# Patient Record
Sex: Male | Born: 1955 | Race: White | Hispanic: No | Marital: Married | Smoking: Never smoker
Health system: Southern US, Community
[De-identification: ages and names within clinical notes are randomized; demographics above are authoritative.]

## PROBLEM LIST (undated history)

## (undated) DIAGNOSIS — J45909 Unspecified asthma, uncomplicated: Secondary | ICD-10-CM

## (undated) HISTORY — DX: Unspecified asthma, uncomplicated: J45.909

---

## 2005-02-14 ENCOUNTER — Ambulatory Visit: Payer: Self-pay | Admitting: Gastroenterology

## 2005-02-19 ENCOUNTER — Ambulatory Visit: Payer: Self-pay | Admitting: Gastroenterology

## 2005-03-08 ENCOUNTER — Ambulatory Visit: Payer: Self-pay | Admitting: Gastroenterology

## 2005-03-19 ENCOUNTER — Ambulatory Visit: Payer: Self-pay | Admitting: Gastroenterology

## 2012-09-21 ENCOUNTER — Ambulatory Visit: Payer: Self-pay | Admitting: Specialist

## 2012-12-16 HISTORY — PX: SHOULDER ARTHROSCOPY W/ ROTATOR CUFF REPAIR: SHX2400

## 2013-01-05 ENCOUNTER — Ambulatory Visit: Payer: Self-pay | Admitting: Specialist

## 2013-01-05 LAB — CBC WITH DIFFERENTIAL/PLATELET
Basophil #: 0 10*3/uL (ref 0.0–0.1)
Basophil %: 0.5 %
Eosinophil %: 1.2 %
HCT: 40.9 % (ref 40.0–52.0)
Lymphocyte #: 1.7 10*3/uL (ref 1.0–3.6)
Lymphocyte %: 33.8 %
MCH: 31.1 pg (ref 26.0–34.0)
MCHC: 34.9 g/dL (ref 32.0–36.0)
MCV: 89 fL (ref 80–100)
Monocyte #: 0.5 x10 3/mm (ref 0.2–1.0)
Monocyte %: 10.6 %
Neutrophil #: 2.7 10*3/uL (ref 1.4–6.5)
Neutrophil %: 53.9 %
Platelet: 186 10*3/uL (ref 150–440)
WBC: 5 10*3/uL (ref 3.8–10.6)

## 2013-01-05 LAB — BASIC METABOLIC PANEL
Anion Gap: 8 (ref 7–16)
BUN: 23 mg/dL — ABNORMAL HIGH (ref 7–18)
Calcium, Total: 8.2 mg/dL — ABNORMAL LOW (ref 8.5–10.1)
Chloride: 106 mmol/L (ref 98–107)
Co2: 26 mmol/L (ref 21–32)
Creatinine: 1.18 mg/dL (ref 0.60–1.30)
EGFR (Non-African Amer.): >60
Potassium: 4.4 mmol/L (ref 3.5–5.1)
Sodium: 140 mmol/L (ref 136–145)

## 2013-01-12 ENCOUNTER — Ambulatory Visit: Payer: Self-pay | Admitting: Specialist

## 2015-04-07 NOTE — Op Note (Signed)
PATIENT NAME:  Jeffery Hayes, Jeffery Hayes MR#:  [id number] DATE OF BIRTH:  Apr 20, 1956  DATE OF PROCEDURE:  01/12/2013  PREOPERATIVE DIAGNOSES: 1.  Tear of the left rotator cuff. 2.  Left acromioclavicular joint arthritis.  3.  Impingement syndrome, left shoulder.  POSTOPERAIVE DIAGNOSES:   1.  A 1.5 cm tear, left rotator cuff, just behind the biceps tendon.  2.  Severe acromioclavicular joint arthritis, left shoulder.  3.  Severe impingement and bursitis, left shoulder.   OPERATIONS:  1.  Arthroscopic left rotator cuff repair using a 5.5 ArthroCare speed screw.  2.  Arthroscopic excision of distal clavicle.  3.  Arthroscopic decompression, left subacromial space, with partial acromioplasty.   SURGEON: Jeffery Hayes, M.D.   ANESTHESIA: General endotracheal plus interscalene block.   COMPLICATIONS: None.   DRAINS: None.   ESTIMATED BLOOD LOSS: Minimal.  REPLACED: None.   OPERATIVE FINDINGS: The patient had a 1.5 cm tear of the rotator cuff just behind the biceps tendon. There was thinning of the cuff behind this, as well. Severe bursitis and impingement of the anterior acromion and clavicle. The glenohumeral joint showed normal articular surfaces and biceps tendon. The undersurface of the rotator cuff showed severe crabmeat-like fraying. Tear was visible from beneath.   OPERATIVE PROCEDURE: The patient was brought to the operating room where he underwent satisfactory general endotracheal anesthesia, after a left interscalene block had been put in place. He was turned in the right lateral decubitus position and padded appropriately on a beanbag. The left shoulder was prepped and draped in sterile fashion and arthroscopy carried out through posterior portal with accessory portals being made anteriorly and laterally. The above findings were noted upon survey in the joint and the bursa. In the joint, the motorized shaver was brought in from the anterior portal and the undersurface of the cuff,  where it was severely frayed, was debrided. This showed that there was a tear in the anterior portion. The biceps tendon was intact and left in place. There was minimal synovitis and minimal debridement of the joint itself. The arthroscope was redirected into the subacromial space and significant bursitis was excised with the shaver. The tear anteriorly was identified and the shaver used to trim it up and clean it. The ArthroCare wand was used to clear soft tissue off the tuberosity. Bur was used to freshen the bone. After the bursectomy, a large bur was brought in from the posterior aspect and anterior acromioplasty done. The bur was brought in from the anterior portal and 8 mm of the distal clavicle was excised in its entirety. Once this had been completed, a MagnumWire suture was placed in the cuff behind the tear and these sutures brought out anteriorly. A second suture was then brought in using the Smart stitch and placed in the cuff, as well. A tap was used to create a hole in the lateral aspect of the tuberosity and a 5 mm speed screw was inserted. A second set of sutures was attached to the speed screw and tightened down snugly. The traction, which was at 12 pounds, was reduced to 5 pounds and the final tightening was done. The speed screw was fired and removed. The sutures were cut within the joint. The cuff was brought out, nicely covering the tuberosity. The remaining suture was brought out through the lateral portal and tied down securely over the cuff to reinforce the repair. This was cut, as well. The joint was thoroughly irrigated. The undersurface of the acromion had been  smoothed off with a rasp. After final irrigation, the stab wounds were closed with 3-0 nylon suture. Marcaine 0.5% with epinephrine and morphine was placed in the joint and the bursa. Dry sterile dressings and TENS pads were applied. The traction was removed and the patient was placed in a sling. He was awakened and taken to  recovery in good condition.     ____________________________ Jeffery HoarHoward E. Elisabeth Strom, MD hem:cc D: 01/12/2013 15:22:00 ET T: 01/12/2013 16:07:03 ET JOB#: [id number]  cc: Jeffery HoarHoward E. Glena Pharris, MD, <Dictator> Jeffery HoarHOWARD E Jager Koska MD ELECTRONICALLY SIGNED 01/13/2013 9:56

## 2016-11-13 ENCOUNTER — Other Ambulatory Visit: Payer: Self-pay | Admitting: Orthopaedic Surgery

## 2016-11-13 DIAGNOSIS — S63522A Sprain of radiocarpal joint of left wrist, initial encounter: Secondary | ICD-10-CM

## 2016-11-25 ENCOUNTER — Ambulatory Visit
Admission: RE | Admit: 2016-11-25 | Discharge: 2016-11-25 | Disposition: A | Discharge status: Home / Self Care | Payer: BLUE CROSS/BLUE SHIELD | Source: Ambulatory Visit | Attending: Orthopaedic Surgery | Admitting: Orthopaedic Surgery

## 2016-11-25 DIAGNOSIS — X58XXXA Exposure to other specified factors, initial encounter: Secondary | ICD-10-CM | POA: Diagnosis not present

## 2016-11-25 DIAGNOSIS — S63522A Sprain of radiocarpal joint of left wrist, initial encounter: Secondary | ICD-10-CM

## 2016-11-25 DIAGNOSIS — S63592A Other specified sprain of left wrist, initial encounter: Secondary | ICD-10-CM | POA: Insufficient documentation

## 2016-11-25 DIAGNOSIS — M25532 Pain in left wrist: Secondary | ICD-10-CM | POA: Diagnosis present

## 2016-11-25 MED ORDER — SODIUM CHLORIDE 0.9 % IJ SOLN
7.0000 mL | Freq: Once | INTRAMUSCULAR | Status: AC
Start: 2016-11-25 — End: 2016-11-25
  Administered 2016-11-25: 7 mL

## 2016-11-25 MED ORDER — IOPAMIDOL (ISOVUE-300) INJECTION 61%
1.0000 mL | Freq: Once | INTRAVENOUS | Status: AC | PRN
  Administered 2016-11-25: 1 mL

## 2016-11-25 MED ORDER — LIDOCAINE HCL (PF) 1 % IJ SOLN
5.0000 mL | Freq: Once | INTRAMUSCULAR | Status: AC
  Administered 2016-11-25: 5 mL
  Filled 2016-11-25: qty 5

## 2016-11-25 MED ORDER — GADOBENATE DIMEGLUMINE 529 MG/ML IV SOLN
0.1000 mL | Freq: Once | INTRAVENOUS | Status: AC | PRN
  Administered 2016-11-25: 0.1 mL via INTRA_ARTICULAR

## 2018-03-02 NOTE — Progress Notes (Signed)
03/03/2018 10:42 AM   Jeffery Hayes 1956/11/21 [id number]  Referring provider: Barbette Reichmann, MD [address] [facility] [place], [place] [address]  No chief complaint on file.   HPI: Patient is a 62 -year-old Caucasian male who presents today as a referral from Dr. Marcello Fennel for microscopic hematuria.    Patient was found to have microscopic hematuria on 02/16/2018 with 4-10 RBC's/hpf.  Patient doesn't have a prior history of microscopic hematuria.    He does not have a prior history of recurrent urinary tract infections, nephrolithiasis, trauma to the genitourinary tract, BPH or malignancies of the genitourinary tract.  His paternal grandfather died of prostate cancer at age 9.  He does not have a family medical history of nephrolithiasis or hematuria.   Today, he is not having symptoms of frequent urination, urgency, dysuria, nocturia, incontinence, hesitancy, intermittency, straining to urinate or a weak urinary stream.  Patient denies any gross hematuria, dysuria or suprapubic/flank pain.  Patient denies any fevers, chills, nausea or vomiting. His UA today demonstrates 3-10 RBC's.    He has not had any recent imaging studies.   He is not a smoker. He is not exposed to secondhand smoke.  He has not worked with worked with Personnel officer, trichloroethylene, etc.   He  has HTN.   He has a high BMI.     PMH: Past Medical History:  Diagnosis Date  . Asthma     Surgical History: Past Surgical History:  Procedure Laterality Date  . SHOULDER ARTHROSCOPY W/ ROTATOR CUFF REPAIR  2014    Home Medications:  Allergies as of 03/03/2018   Not on File     Medication List        Accurate as of 03/03/18 10:42 AM. Always use your most recent med list.          aspirin EC 81 MG tablet Take 81 mg by mouth daily.       Allergies: Allergies not on file  Family History: Family History  Problem Relation Age of Onset  . Prostate cancer Paternal  Grandfather   . Bladder Cancer Neg Hx   . Kidney cancer Neg Hx     Social History:  has no tobacco, alcohol, and drug history on file.  ROS: UROLOGY Frequent Urination?: No Hard to postpone urination?: No Burning/pain with urination?: No Get up at night to urinate?: No Leakage of urine?: No Urine stream starts and stops?: No Trouble starting stream?: No Do you have to strain to urinate?: No Blood in urine?: Yes Urinary tract infection?: No Sexually transmitted disease?: No Injury to kidneys or bladder?: No Painful intercourse?: No Weak stream?: No Erection problems?: No Penile pain?: No  Gastrointestinal Nausea?: No Vomiting?: No Indigestion/heartburn?: No Diarrhea?: No Constipation?: No  Constitutional Fever: No Night sweats?: No Weight loss?: No Fatigue?: No  Skin Skin rash/lesions?: No Itching?: No  Eyes Blurred vision?: No Double vision?: No  Ears/Nose/Throat Sore throat?: No Sinus problems?: No  Hematologic/Lymphatic Swollen glands?: No Easy bruising?: No  Cardiovascular Leg swelling?: No Chest pain?: No  Respiratory Cough?: No Shortness of breath?: No  Endocrine Excessive thirst?: No  Musculoskeletal Back pain?: No Joint pain?: No  Neurological Headaches?: No Dizziness?: No  Psychologic Depression?: No Anxiety?: No  Physical Exam: BP 119/73   Pulse 61   Resp 16   Ht 5\' 10"  (1.778 m)   Wt 189 lb 6.4 oz (85.9 kg)   SpO2 97%   BMI 27.18 kg/m  Constitutional: Well nourished. Alert and oriented, No acute distress. HEENT: [place] AT, moist mucus membranes. Trachea midline, no masses. Cardiovascular: No clubbing, cyanosis, or edema. Respiratory: Normal respiratory effort, no increased work of breathing. GI: Abdomen is soft, non tender, non distended, no abdominal masses. Liver and spleen not palpable.  No hernias appreciated.  Stool sample for occult testing is not indicated.   GU: No CVA tenderness.  No bladder fullness or  masses.  Patient with circumcised phallus.  Urethral meatus is patent.  No penile discharge. No penile lesions or rashes. Scrotum without lesions, cysts, rashes and/or edema.  Testicles are located scrotally bilaterally. No masses are appreciated in the testicles. Left and right epididymis are normal. Rectal: Patient with  normal sphincter tone. Anus and perineum without scarring or rashes. No rectal masses are appreciated. Prostate is approximately 35 grams, no nodules are appreciated. Seminal vesicles are normal. Skin: No rashes, bruises or suspicious lesions. Lymph: No cervical or inguinal adenopathy. Neurologic: Grossly intact, no focal deficits, moving all 4 extremities. Psychiatric: Normal mood and affect.  Laboratory Data: PSA History 0.26 ng/mL on 02/16/2018 Lab Results  Component Value Date   WBC 5.0 01/05/2013   HGB 14.3 01/05/2013   HCT 40.9 01/05/2013   MCV 89 01/05/2013   PLT 186 01/05/2013    Lab Results  Component Value Date   CREATININE 1.18 01/05/2013    No results found for: PSA  No results found for: TESTOSTERONE  No results found for: HGBA1C  No results found for: TSH  No results found for: CHOL, HDL, CHOLHDL, VLDL, LDLCALC  No results found for: AST No results found for: ALT No components found for: ALKALINEPHOPHATASE No components found for: BILIRUBINTOTAL  No results found for: ESTRADIOL   Urinalysis 3-10 RBC's.  See Epic.    I have reviewed the labs.   Assessment & Plan:    1. Microscopic hematuria  - I explained to the patient that there are a number of causes that can be associated with blood in the urine, such as stones, BPH, UTI's, damage to the urinary tract and/or cancer.  - At this time, I felt that the patient warranted further urologic evaluation.   The AUA guidelines state that a CT urogram is the preferred imaging study to evaluate hematuria.  - I explained to the patient that a contrast material will be injected into a vein and  that in rare instances, an allergic reaction can result and may even life threatening   The patient denies any allergies to contrast, iodine and/or seafood and is not taking metformin.  - Following the imaging study,  I've recommended a cystoscopy. I described how this is performed, typically in an office setting with a flexible cystoscope. We described the risks, benefits, and possible side effects, the most common of which is a minor amount of blood in the urine and/or burning which usually resolves in 24 to 48 hours.    - The patient had the opportunity to ask questions which were answered. Based upon this discussion, the patient is willing to proceed. Therefore, I've ordered: a CT Urogram and cystoscopy.  - The patient will return following all of the above for discussion of the results.   - UA  - Urine culture  - BUN + creatinine      Return for CT Urogram report and cystoscopy.  These notes generated with voice recognition software. I apologize for typographical errors.  Michiel CowboySHANNON Keairra Bardon, PA-C  [facility] [address], [address]  [place], [place] [address] [phone]

## 2018-03-03 ENCOUNTER — Encounter: Payer: Self-pay | Admitting: Urology

## 2018-03-03 ENCOUNTER — Ambulatory Visit (INDEPENDENT_AMBULATORY_CARE_PROVIDER_SITE_OTHER): Payer: BLUE CROSS/BLUE SHIELD | Admitting: Urology

## 2018-03-03 VITALS — BP 119/73 | HR 61 | Resp 16 | Ht 70.0 in | Wt 189.4 lb

## 2018-03-03 DIAGNOSIS — R3129 Other microscopic hematuria: Secondary | ICD-10-CM

## 2018-03-03 LAB — URINALYSIS, COMPLETE
BILIRUBIN UA: NEGATIVE
GLUCOSE, UA: NEGATIVE
LEUKOCYTES UA: NEGATIVE
Nitrite, UA: NEGATIVE
PROTEIN UA: NEGATIVE
SPEC GRAV UA: 1.02 (ref 1.005–1.030)
Urobilinogen, Ur: 0.2 mg/dL (ref 0.2–1.0)
pH, UA: 6 (ref 5.0–7.5)

## 2018-03-03 LAB — MICROSCOPIC EXAMINATION: Epithelial Cells (non renal): NONE SEEN /hpf (ref 0–10)

## 2018-03-03 NOTE — Patient Instructions (Addendum)
Hematuria, Adult Hematuria is blood in your urine. It can be caused by a bladder infection, kidney infection, prostate infection, kidney stone, or cancer of your urinary tract. Infections can usually be treated with medicine, and a kidney stone usually will pass through your urine. If neither of these is the cause of your hematuria, further workup to find out the reason may be needed. It is very important that you tell your health care provider about any blood you see in your urine, even if the blood stops without treatment or happens without causing pain. Blood in your urine that happens and then stops and then happens again can be a symptom of a very serious condition. Also, pain is not a symptom in the initial stages of many urinary cancers. Follow these instructions at home:  Drink lots of fluid, 3-4 quarts a day. If you have been diagnosed with an infection, cranberry juice is especially recommended, in addition to large amounts of water.  Avoid caffeine, tea, and carbonated beverages because they tend to irritate the bladder.  Avoid alcohol because it may irritate the prostate.  Take all medicines as directed by your health care provider.  If you were prescribed an antibiotic medicine, finish it all even if you start to feel better.  If you have been diagnosed with a kidney stone, follow your health care provider's instructions regarding straining your urine to catch the stone.  Empty your bladder often. Avoid holding urine for long periods of time.  After a bowel movement, women should cleanse front to back. Use each tissue only once.  Empty your bladder before and after sexual intercourse if you are a male. Contact a health care provider if:  You develop back pain.  You have a fever.  You have a feeling of sickness in your stomach (nausea) or vomiting.  Your symptoms are not better in 3 days. Return sooner if you are getting worse. Get help right away if:  You develop  severe vomiting and are unable to keep the medicine down.  You develop severe back or abdominal pain despite taking your medicines.  You begin passing a large amount of blood or clots in your urine.  You feel extremely weak or faint, or you pass out. This information is not intended to replace advice given to you by your health care provider. Make sure you discuss any questions you have with your health care provider. Document Released: 12/02/2005 Document Revised: 05/09/2016 Document Reviewed: 08/02/2013 Elsevier Interactive Patient Education  2017 Elsevier [employer].  CT Scan A CT scan (computed tomography scan) is an imaging scan. It uses X-rays and a computer to make detailed pictures of different areas inside the body. A CT scan can give more information than a regular X-ray exam. A CT scan provides data about internal organs, soft tissue structures, blood vessels, and bones. In this procedure, the pictures will be taken in a large machine that has an opening (CT scanner). Tell a health care provider about:  Any allergies you have.  All medicines you are taking, including vitamins, herbs, eye drops, creams, and over-the-counter medicines.  Any blood disorders you have.  Any surgeries you have had.  Any medical conditions you have.  Whether you are pregnant or may be pregnant. What are the risks? Generally, this is a safe procedure. However, problems may occur, including:  An allergic reaction to dyes.  Development of cancer from excessive exposure to radiation from multiple CT scans. This is rare.  What happens before   the procedure? Staying hydrated Follow instructions from your health care provider about hydration, which may include:  Up to 2 hours before the procedure - you may continue to drink clear liquids, such as water, clear fruit juice, black coffee, and plain tea.  Eating and drinking restrictions Follow instructions from your health care provider about eating and  drinking, which may include:  24 hours before the procedure - stop drinking caffeinated beverages, such as energy drinks, tea, soda, coffee, and hot chocolate.  8 hours before the procedure - stop eating heavy meals or foods such as meat, fried foods, or fatty foods.  6 hours before the procedure - stop eating light meals or foods, such as toast or cereal.  6 hours before the procedure - stop drinking milk or drinks that contain milk.  2 hours before the procedure - stop drinking clear liquids.  General instructions  Remove any jewelry.  Ask your health care provider about changing or stopping your regular medicines. This is especially important if you are taking diabetes medicines or blood thinners. What happens during the procedure?  You will lie on a table with your arms above your head.  An IV tube may be inserted into one of your veins.  The contrast dye may be injected into the IV tube. You may feel warm or have a metallic taste in your mouth.  The table you will be lying on will move into the CT scanner.  You will be able to see, hear, and talk to the person running the machine while you are in it. Follow that person's instructions.  The CT scanner will move around you to take pictures. Do not move while it is scanning. Staying still helps the scanner to get a good image.  When the best possible pictures have been taken, the machine will be turned off. The table will be moved out of the machine.  The IV tube will be removed. The procedure may vary among health care providers and hospitals. What happens after the procedure?  It is up to you to get the results of your procedure. Ask your health care provider, or the department that is doing the procedure, when your results will be ready. Summary  A CT scan is an imaging scan.  A CT scan uses X-rays and a computer to make detailed pictures of different areas of your body.  Follow instructions from your health care  provider about eating and drinking before the procedure.  You will be able to see, hear, and talk to the person running the machine while you are in it. Follow that person's instructions. This information is not intended to replace advice given to you by your health care provider. Make sure you discuss any questions you have with your health care provider. Document Released: 01/09/2005 Document Revised: 01/04/2017 Document Reviewed: 01/04/2017 Elsevier Interactive Patient Education  2018 Else[employer] Inc.  Cystoscopy Cystoscopy is a procedure that is used to help diagnose and sometimes treat conditions that affect that lower urinary tract. The lower urinary tract includes the bladder and the tube that drains urine from the bladder out of the body (urethra). Cystoscopy is performed with a thin, tube-shaped instrument with a light and camera at the end (cystoscope). The cystoscope may be hard (rigid) or flexible, depending on the goal of the procedure.The cystoscope is inserted through the urethra, into the bladder. Cystoscopy may be recommended if you have:  Urinary tractinfections that keep coming back (recurring).  Blood in the urine (hematuria).    Loss of bladder control (urinary incontinence) or an overactive bladder.  Unusual cells found in a urine sample.  A blockage in the urethra.  Painful urination.  An abnormality in the bladder found during an intravenous pyelogram (IVP) or CT scan.  Cystoscopy may also be done to remove a sample of tissue to be examined under a microscope (biopsy). Tell a health care provider about:  Any allergies you have.  All medicines you are taking, including vitamins, herbs, eye drops, creams, and over-the-counter medicines.  Any problems you or family members have had with anesthetic medicines.  Any blood disorders you have.  Any surgeries you have had.  Any medical conditions you have.  Whether you are pregnant or may be pregnant. What are the  risks? Generally, this is a safe procedure. However, problems may occur, including:  Infection.  Bleeding.  Allergic reactions to medicines.  Damage to other structures or organs.  What happens before the procedure?  Ask your health care provider about: ? Changing or stopping your regular medicines. This is especially important if you are taking diabetes medicines or blood thinners. ? Taking medicines such as aspirin and ibuprofen. These medicines can thin your blood. Do not take these medicines before your procedure if your health care provider instructs you not to.  Follow instructions from your health care provider about eating or drinking restrictions.  You may be given antibiotic medicine to help prevent infection.  You may have an exam or testing, such as X-rays of the bladder, urethra, or kidneys.  You may have urine tests to check for signs of infection.  Plan to have someone take you home after the procedure. What happens during the procedure?  To reduce your risk of infection,your health care team will wash or sanitize their hands.  You will be given one or more of the following: ? A medicine to help you relax (sedative). ? A medicine to numb the area (local anesthetic).  The area around the opening of your urethra will be cleaned.  The cystoscope will be passed through your urethra into your bladder.  Germ-free (sterile)fluid will flow through the cystoscope to fill your bladder. The fluid will stretch your bladder so that your surgeon can clearly examine your bladder walls.  The cystoscope will be removed and your bladder will be emptied. The procedure may vary among health care providers and hospitals. What happens after the procedure?  You may have some soreness or pain in your abdomen and urethra. Medicines will be available to help you.  You may have some blood in your urine.  Do not drive for 24 hours if you received a sedative. This information is  not intended to replace advice given to you by your health care provider. Make sure you discuss any questions you have with your health care provider. Document Released: 11/29/2000 Document Revised: 04/11/2016 Document Reviewed: 10/19/2015 Elsevier Interactive Patient Education  2018 Elsevier Inc.  

## 2018-03-04 LAB — BUN+CREAT
BUN/Creatinine Ratio: 16 (ref 10–24)
BUN: 19 mg/dL (ref 8–27)
CREATININE: 1.18 mg/dL (ref 0.76–1.27)
GFR calc Af Amer: 77 mL/min/{1.73_m2} (ref >59)
GFR, EST NON AFRICAN AMERICAN: 67 mL/min/{1.73_m2} (ref >59)

## 2018-03-06 LAB — CULTURE, URINE COMPREHENSIVE

## 2018-03-19 ENCOUNTER — Telehealth: Payer: Self-pay | Admitting: Urology

## 2018-03-19 NOTE — Telephone Encounter (Signed)
Patient cx his CT scan and cysto He would have to pay $3000.00 for his CT scan and he said he is not having any major issues and wants to hold off for now.  Jeffery DusterMichelle

## 2018-03-23 ENCOUNTER — Ambulatory Visit: Payer: BLUE CROSS/BLUE SHIELD

## 2018-03-26 ENCOUNTER — Other Ambulatory Visit: Payer: BLUE CROSS/BLUE SHIELD | Admitting: Urology

## 2018-04-02 IMAGING — RF DG ARTHROGRAM WRIST*L*
1 series · 2 of 2 positions shown · non-contrast
Comparison: None.

INDICATION: Left wrist pain for 8 months

EXAM:
ARTHROCENTESIS OF RIGHT HIP JOINT UNDER FLUOROSCOPIC GUIDANCE

[Series 1: cp_standard · 0.18mm/px · 2 of 2 slices shown]
[im 1/2]
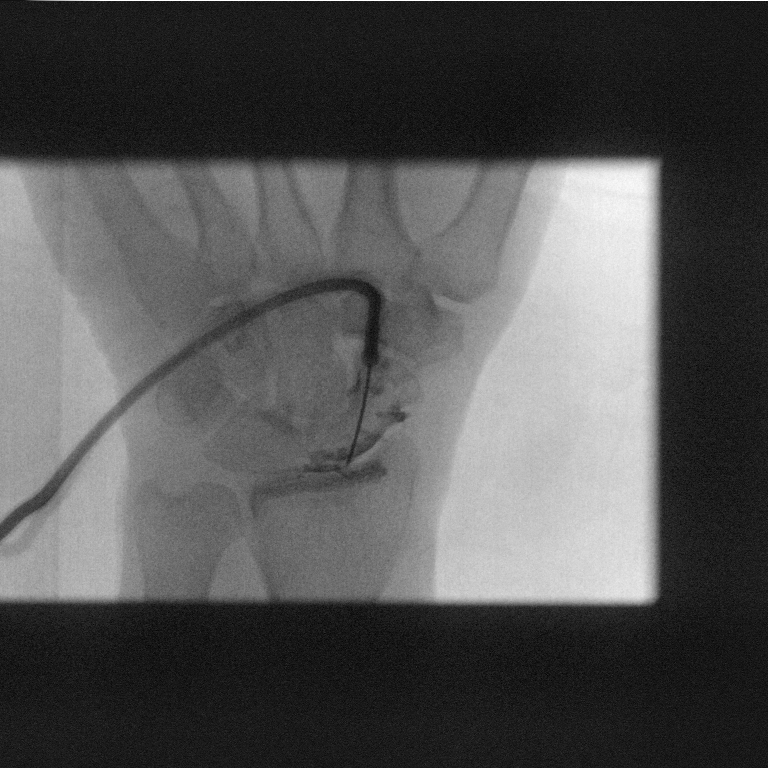
[im 2/2]
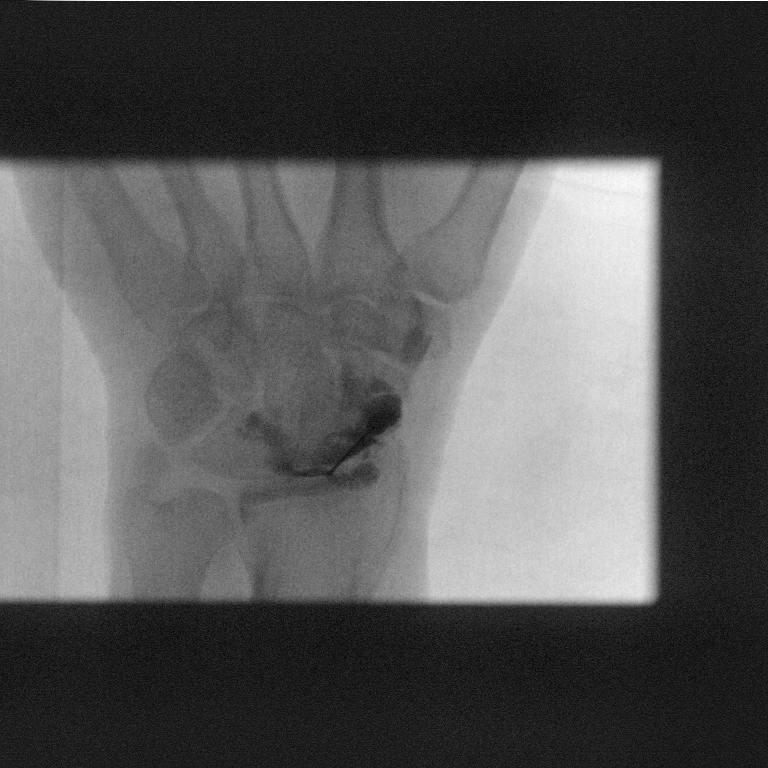

[2 of 2 positions shown; findings below may reference images not displayed]

FLUOROSCOPY TIME:  Fluoroscopy Time:  0.7 minutes

Radiation Exposure Index (if provided by the fluoroscopic device):
0.7 mGy

Number of Acquired Spot Images: 0

COMPLICATIONS:
None immediate.

PROCEDURE:
The risks and benefits of the procedure were discussed with the
patient, and written informed consent was obtained. The patient
stated no history of allergy to contrast media. A formal timeout
procedure was performed with the patient according to departmental
protocol.

The patient was placed supine on the fluoroscopy table and the left
radiocarpal joint was identified under fluoroscopy. The skin
overlying the left radiocarpal joint was subsequently cleaned with
Chloraprep and a sterile drape was placed over the area of interest.
5 ml 1% Lidocaine was used to anesthetize the skin around the needle
insertion site.

A 22 gauge spinal needle was inserted into the left radiocarpal
joint under fluoroscopy. Position was confirmed with injection of
less than 1ml of Esovue-QRR under fluoroscopy.

7 ml of gadolinium mixture (0.1 ml of Multihance mixed with 20 ml of
sterile saline) was injected into the left radiocarpal joint.

The needle was removed and hemostasis was achieved. The patient was
subsequently transferred to MRI for imaging.
IMPRESSION: Successful fluoroscopic guided left wrist arthrogram prior to MRI.

## 2024-05-07 DIAGNOSIS — D649 Anemia, unspecified: Secondary | ICD-10-CM | POA: Diagnosis not present

## 2024-05-07 DIAGNOSIS — R7309 Other abnormal glucose: Secondary | ICD-10-CM | POA: Diagnosis not present

## 2024-05-07 DIAGNOSIS — Z0000 Encounter for general adult medical examination without abnormal findings: Secondary | ICD-10-CM | POA: Diagnosis not present

## 2024-05-07 DIAGNOSIS — M722 Plantar fascial fibromatosis: Secondary | ICD-10-CM | POA: Diagnosis not present

## 2024-05-07 DIAGNOSIS — R790 Abnormal level of blood mineral: Secondary | ICD-10-CM | POA: Diagnosis not present

## 2024-05-14 DIAGNOSIS — Z0000 Encounter for general adult medical examination without abnormal findings: Secondary | ICD-10-CM | POA: Diagnosis not present

## 2024-05-14 DIAGNOSIS — M25511 Pain in right shoulder: Secondary | ICD-10-CM | POA: Diagnosis not present

## 2024-05-14 DIAGNOSIS — R7309 Other abnormal glucose: Secondary | ICD-10-CM | POA: Diagnosis not present

## 2024-05-14 DIAGNOSIS — R790 Abnormal level of blood mineral: Secondary | ICD-10-CM | POA: Diagnosis not present

## 2024-05-14 DIAGNOSIS — G8929 Other chronic pain: Secondary | ICD-10-CM | POA: Diagnosis not present

## 2024-05-14 DIAGNOSIS — Z6827 Body mass index (BMI) 27.0-27.9, adult: Secondary | ICD-10-CM | POA: Diagnosis not present

## 2024-05-14 DIAGNOSIS — Z1331 Encounter for screening for depression: Secondary | ICD-10-CM | POA: Diagnosis not present

## 2024-10-04 DIAGNOSIS — J4541 Moderate persistent asthma with (acute) exacerbation: Secondary | ICD-10-CM | POA: Diagnosis not present
